# Patient Record
Sex: Male | Born: 1965 | Race: White | Hispanic: No | Marital: Married | Smoking: Never smoker
Health system: Southern US, Community
[De-identification: ages and names within clinical notes are randomized; demographics above are authoritative.]

## PROBLEM LIST (undated history)

## (undated) DIAGNOSIS — T7840XA Allergy, unspecified, initial encounter: Secondary | ICD-10-CM

## (undated) HISTORY — DX: Allergy, unspecified, initial encounter: T78.40XA

---

## 1999-10-20 ENCOUNTER — Encounter: Admission: RE | Admit: 1999-10-20 | Discharge: 1999-10-20 | Payer: Self-pay | Admitting: Family Medicine

## 1999-10-20 ENCOUNTER — Encounter: Payer: Self-pay | Admitting: Family Medicine

## 2005-03-29 ENCOUNTER — Encounter: Admission: RE | Admit: 2005-03-29 | Discharge: 2005-03-29 | Payer: Self-pay | Admitting: Family Medicine

## 2006-05-04 ENCOUNTER — Encounter: Admission: RE | Admit: 2006-05-04 | Discharge: 2006-05-04 | Payer: Self-pay | Admitting: Family Medicine

## 2007-01-23 ENCOUNTER — Encounter: Admission: RE | Admit: 2007-01-23 | Discharge: 2007-01-23 | Payer: Self-pay | Admitting: Neurology

## 2007-02-19 ENCOUNTER — Encounter: Admission: RE | Admit: 2007-02-19 | Discharge: 2007-02-19 | Payer: Self-pay | Admitting: Neurology

## 2008-06-15 ENCOUNTER — Encounter: Admission: RE | Admit: 2008-06-15 | Discharge: 2008-06-15 | Payer: Self-pay | Admitting: Family Medicine

## 2010-06-05 IMAGING — CR DG CHEST 2V
2 series · 2 of 2 positions shown · non-contrast
Comparison: None

CLINICAL DATA: Cough.  Nonsmoker.

CHEST - 2 VIEW

[w chest pa]
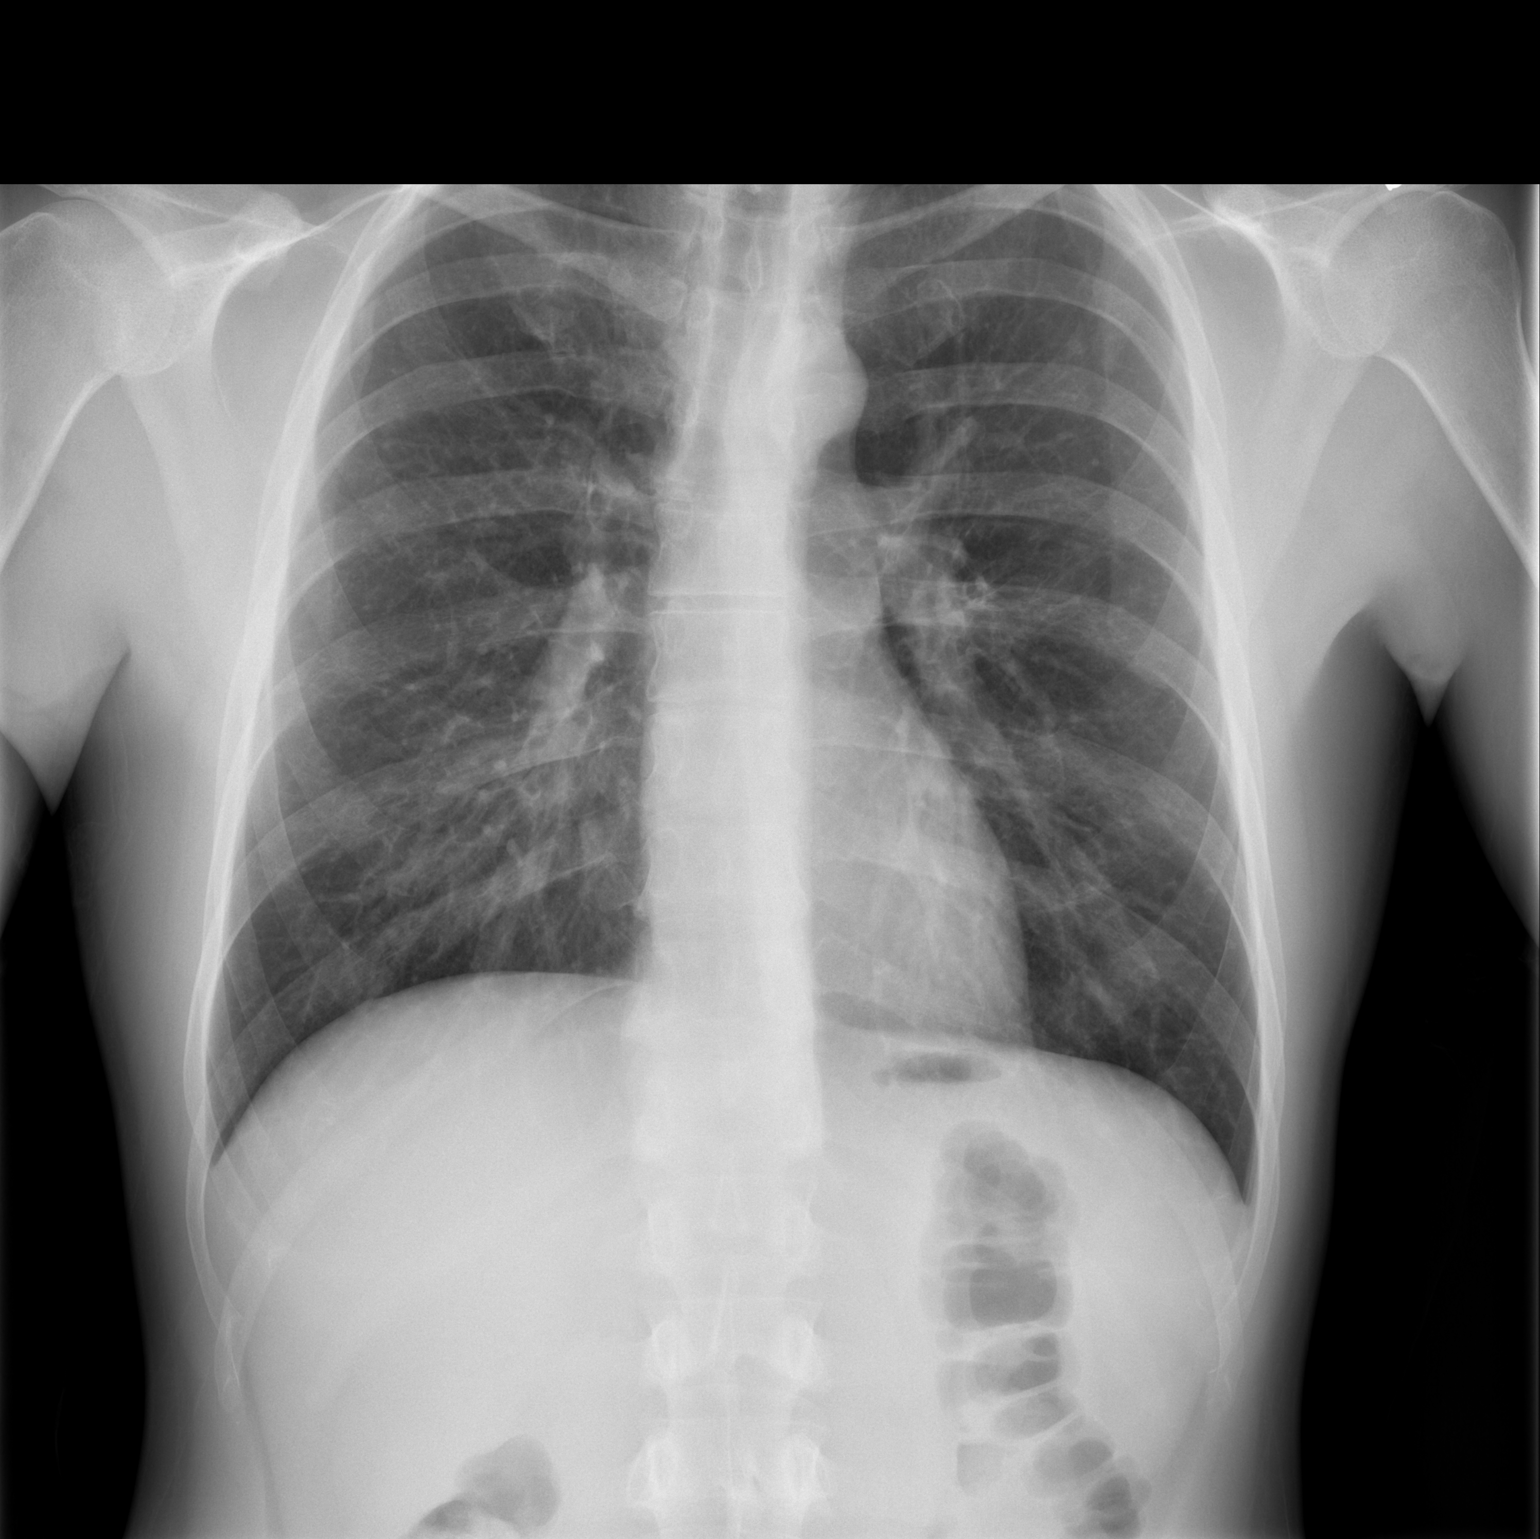

[w chest lat]
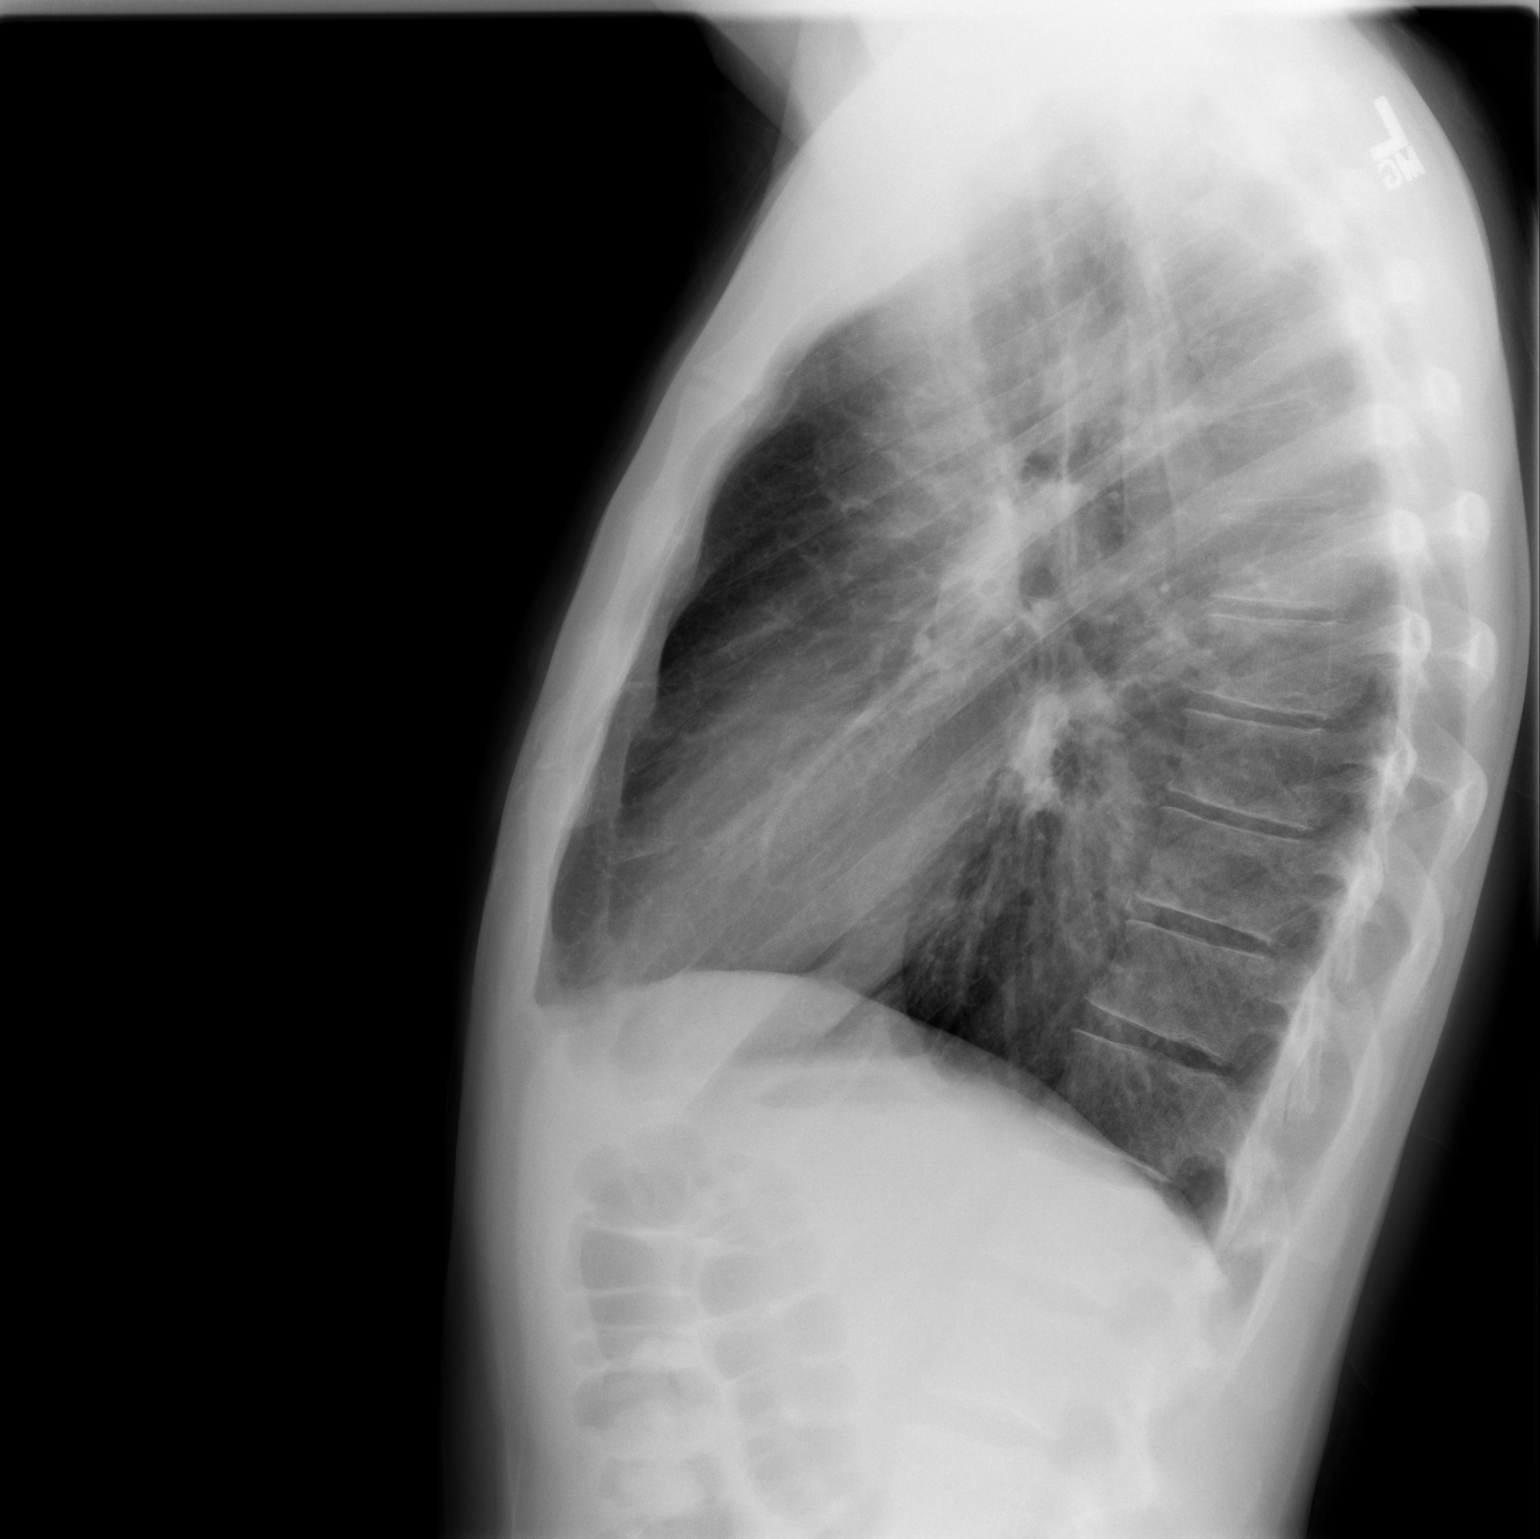

[2 of 2 positions shown; findings below may reference images not displayed]

FINDINGS: Generalized prominence bronchopulmonary markings of
asthma or bronchitis seen.  No focal pneumonia noted with lungs
otherwise clear.  Heart size and configuration appear normal.
Mediastinum, hila, pleura and osseous structures are unremarkable.
IMPRESSION: 1.  Slight changes of asthma or bronchitis without pneumonia.
2.  Otherwise no active cardiopulmonary disease.

REF:G3 DICTATED: 06/15/2008 [DATE]

## 2018-01-23 DIAGNOSIS — R0683 Snoring: Secondary | ICD-10-CM | POA: Insufficient documentation

## 2018-02-03 ENCOUNTER — Other Ambulatory Visit (HOSPITAL_BASED_OUTPATIENT_CLINIC_OR_DEPARTMENT_OTHER): Payer: Self-pay

## 2018-02-03 DIAGNOSIS — R0683 Snoring: Secondary | ICD-10-CM

## 2018-02-18 ENCOUNTER — Ambulatory Visit (HOSPITAL_BASED_OUTPATIENT_CLINIC_OR_DEPARTMENT_OTHER): Payer: Managed Care, Other (non HMO) | Attending: Otolaryngology | Admitting: Internal Medicine

## 2018-02-18 VITALS — Ht 71.0 in | Wt 150.0 lb

## 2018-02-18 DIAGNOSIS — R0683 Snoring: Secondary | ICD-10-CM | POA: Diagnosis not present

## 2018-02-22 DIAGNOSIS — R0683 Snoring: Secondary | ICD-10-CM | POA: Diagnosis not present

## 2018-02-22 NOTE — Procedures (Signed)
   Patient Name: Dwayne Dean, Dwayne Dean Date: 02/18/2018 Gender: Male D.O.B: January 20, 1966 Age (years): 11 Referring Provider: Izora Gala Height (inches): 60 Interpreting Physician: Baird Lyons MD, ABSM Weight (lbs): 150 RPSGT: Jacolyn Reedy BMI: 21 MRN: [id number] Neck Size: 16.00  CLINICAL INFORMATION Sleep Study Type: HST Indication for sleep study: Snoring  Epworth Sleepiness Score: 7  SLEEP STUDY TECHNIQUE A multi-channel overnight portable sleep study was performed. The channels recorded were: nasal airflow, thoracic respiratory movement, and oxygen saturation with a pulse oximetry. Snoring was also monitored.  MEDICATIONS Patient self administered medications include: none reported.  SLEEP ARCHITECTURE Patient was studied for 425.3 minutes. The sleep efficiency was 99.5 % and the patient was supine for 59.3%. The arousal index was 0.0 per hour.  RESPIRATORY PARAMETERS The overall AHI was 3.0 per hour, with a central apnea index of 0.0 per hour. The oxygen nadir was 92% during sleep.  CARDIAC DATA Mean heart rate during sleep was 49.5 bpm.  IMPRESSIONS - No significant obstructive sleep apnea occurred during this study (AHI = 3.0/h). - No significant central sleep apnea occurred during this study (CAI = 0.0/h). - The patient had minimal or no oxygen desaturation during the study (Min O2 = 92%) - Patient snored.  DIAGNOSIS - Normal study - Primary snoring  RECOMMENDATIONS - Consider measures to address snoring based on clinical judgment. - Sleep hygiene should be reviewed to assess factors that may improve sleep quality. - Weight management and regular exercise should be initiated or continued.  [Electronically signed] 02/22/2018 12:00 PM  Baird Lyons MD, [place], American Board of Sleep Medicine   NPI: [id number]                          [place], [place] [facility]  ELECTRONICALLY SIGNED  ON:  02/22/2018, 11:58 AM [place] PH: [phone]   FX: [phone] [place]

## 2019-04-22 ENCOUNTER — Encounter: Payer: Self-pay | Admitting: Internal Medicine

## 2019-05-26 ENCOUNTER — Ambulatory Visit (AMBULATORY_SURGERY_CENTER): Payer: Self-pay | Admitting: *Deleted

## 2019-05-26 ENCOUNTER — Other Ambulatory Visit: Payer: Self-pay

## 2019-05-26 VITALS — Temp 97.1°F | Ht 70.5 in | Wt 145.0 lb

## 2019-05-26 DIAGNOSIS — Z1211 Encounter for screening for malignant neoplasm of colon: Secondary | ICD-10-CM

## 2019-05-26 DIAGNOSIS — Z01818 Encounter for other preprocedural examination: Secondary | ICD-10-CM

## 2019-05-26 NOTE — Progress Notes (Signed)

## 2019-06-01 ENCOUNTER — Encounter: Payer: Self-pay | Admitting: Internal Medicine

## 2019-06-04 ENCOUNTER — Other Ambulatory Visit: Payer: Self-pay | Admitting: Internal Medicine

## 2019-06-04 ENCOUNTER — Ambulatory Visit (INDEPENDENT_AMBULATORY_CARE_PROVIDER_SITE_OTHER): Payer: Managed Care, Other (non HMO)

## 2019-06-04 DIAGNOSIS — Z1159 Encounter for screening for other viral diseases: Secondary | ICD-10-CM

## 2019-06-04 LAB — SARS CORONAVIRUS 2 (TAT 6-24 HRS): SARS Coronavirus 2: NEGATIVE

## 2019-06-09 ENCOUNTER — Other Ambulatory Visit: Payer: Self-pay

## 2019-06-09 ENCOUNTER — Encounter: Payer: Self-pay | Admitting: Internal Medicine

## 2019-06-09 ENCOUNTER — Ambulatory Visit (AMBULATORY_SURGERY_CENTER): Payer: Managed Care, Other (non HMO) | Admitting: Internal Medicine

## 2019-06-09 VITALS — BP 96/59 | HR 57 | Temp 96.6°F | Resp 18 | Ht 70.0 in | Wt 145.0 lb

## 2019-06-09 DIAGNOSIS — D120 Benign neoplasm of cecum: Secondary | ICD-10-CM

## 2019-06-09 DIAGNOSIS — Z1211 Encounter for screening for malignant neoplasm of colon: Secondary | ICD-10-CM | POA: Diagnosis not present

## 2019-06-09 MED ORDER — SODIUM CHLORIDE 0.9 % IV SOLN: 500.0000 mL | Freq: Once | INTRAVENOUS | Status: DC

## 2019-06-09 NOTE — Patient Instructions (Addendum)
I found and removed a very small - tiny polyp. 1-2 mm.  It might be pre-cancerous - not a problem but we remove those so they won't become a problem.  I will let you know - the earliest repeat exam would be in 7 years.  I appreciate the opportunity to care for you. Gatha Mayer, MD, [facility]  Handout given for polyps.  YOU HAD AN ENDOSCOPIC PROCEDURE TODAY AT [facility]:   Refer to the procedure report that was given to you for any specific questions about what was found during the examination.  If the procedure report does not answer your questions, please call your gastroenterologist to clarify.  If you requested that your care partner not be given the details of your procedure findings, then the procedure report has been included in a sealed envelope for you to review at your convenience later.  YOU SHOULD EXPECT: Some feelings of bloating in the abdomen. Passage of more gas than usual.  Walking can help get rid of the air that was put into your GI tract during the procedure and reduce the bloating. If you had a lower endoscopy (such as a colonoscopy or flexible sigmoidoscopy) you may notice spotting of blood in your stool or on the toilet paper. If you underwent a bowel prep for your procedure, you may not have a normal bowel movement for a few days.  Please Note:  You might notice some irritation and congestion in your nose or some drainage.  This is from the oxygen used during your procedure.  There is no need for concern and it should clear up in a day or so.  SYMPTOMS TO REPORT IMMEDIATELY:   Following lower endoscopy (colonoscopy or flexible sigmoidoscopy):  Excessive amounts of blood in the stool  Significant tenderness or worsening of abdominal pains  Swelling of the abdomen that is new, acute  Fever of 100F or higher  For urgent or emergent issues, a gastroenterologist can be reached at any hour by calling [phone].   DIET:  We do recommend a small  meal at first, but then you may proceed to your regular diet.  Drink plenty of fluids but you should avoid alcoholic beverages for 24 hours.  ACTIVITY:  You should plan to take it easy for the rest of today and you should NOT DRIVE or use heavy machinery until tomorrow (because of the sedation medicines used during the test).    FOLLOW UP: Our staff will call the number listed on your records 48-72 hours following your procedure to check on you and address any questions or concerns that you may have regarding the information given to you following your procedure. If we do not reach you, we will leave a message.  We will attempt to reach you two times.  During this call, we will ask if you have developed any symptoms of COVID 19. If you develop any symptoms (ie: fever, flu-like symptoms, shortness of breath, cough etc.) before then, please call [phone].  If you test positive for Covid 19 in the 2 weeks post procedure, please call and report this information to [place].    If any biopsies were taken you will be contacted by phone or by letter within the next 1-3 weeks.  Please call us at [phone] if you have not heard about the biopsies in 3 weeks.    SIGNATURES/CONFIDENTIALITY: You and/or your care partner have signed paperwork which will be entered into your electronic medical record.  These signatures attest to the fact that that the information above on your After Visit Summary has been reviewed and is understood.  Full responsibility of the confidentiality of this discharge information lies with you and/or your care-partner.

## 2019-06-09 NOTE — Progress Notes (Signed)
Called to room to assist during endoscopic procedure.  Patient ID and intended procedure confirmed with present staff. Received instructions for my participation in the procedure from the performing physician.  

## 2019-06-09 NOTE — Progress Notes (Signed)
Pt's states no medical or surgical changes since previsit or office visit. 

## 2019-06-09 NOTE — Progress Notes (Signed)
Pt tolerated well. VSS. Awake and to recovery. 

## 2019-06-09 NOTE — Op Note (Signed)
[place] Patient Name: Dwayne Dean Procedure Date: 06/09/2019 9:14 AM MRN: [id number] Endoscopist: Gatha Mayer , MD Age: 54 Referring MD:  Date of Birth: 12-23-1965 Gender: Male Account #: [id number] Procedure:                Colonoscopy Indications:              Screening for colorectal malignant neoplasm, This                            is the patient's first colonoscopy Medicines:                Propofol per Anesthesia, Monitored Anesthesia Care Procedure:                Pre-Anesthesia Assessment:                           - Prior to the procedure, a History and Physical                            was performed, and patient medications and                            allergies were reviewed. The patient's tolerance of                            previous anesthesia was also reviewed. The risks                            and benefits of the procedure and the sedation                            options and risks were discussed with the patient.                            All questions were answered, and informed consent                            was obtained. Prior Anticoagulants: The patient has                            taken no previous anticoagulant or antiplatelet                            agents. ASA Grade Assessment: I - A normal, healthy                            patient. After reviewing the risks and benefits,                            the patient was deemed in satisfactory condition to                            undergo the procedure.  After obtaining informed consent, the colonoscope                            was passed under direct vision. Throughout the                            procedure, the patient's blood pressure, pulse, and                            oxygen saturations were monitored continuously. The                            Colonoscope was introduced through the anus and                            advanced to the  the cecum, identified by                            appendiceal orifice and ileocecal valve. The                            colonoscopy was performed without difficulty. The                            patient tolerated the procedure well. The quality                            of the bowel preparation was excellent. The                            ileocecal valve, appendiceal orifice, and rectum                            were photographed. The bowel preparation used was                            Miralax via split dose instruction. Scope In: 9:28:57 AM Scope Out: 9:45:54 AM Scope Withdrawal Time: 0 hours 10 minutes 56 seconds  Total Procedure Duration: 0 hours 16 minutes 57 seconds  Findings:                 The perianal and digital rectal examinations were                            normal. Pertinent negatives include normal prostate                            (size, shape, and consistency).                           A 1 to 2 mm polyp was found in the cecum. The polyp                            was sessile. The polyp was removed with a cold  biopsy forceps. Resection and retrieval were                            complete. Verification of patient identification                            for the specimen was done. Estimated blood loss was                            minimal.                           The exam was otherwise without abnormality on                            direct and retroflexion views. Complications:            No immediate complications. Estimated Blood Loss:     Estimated blood loss was minimal. Impression:               - One 1 to 2 mm polyp in the cecum, removed with a                            cold biopsy forceps. Resected and retrieved.                           - The examination was otherwise normal on direct                            and retroflexion views. Recommendation:           - Patient has a contact number available for                             emergencies. The signs and symptoms of potential                            delayed complications were discussed with the                            patient. Return to normal activities tomorrow.                            Written discharge instructions were provided to the                            patient.                           - Resume previous diet.                           - Continue present medications.                           - Repeat colonoscopy is recommended. The  colonoscopy date will be determined after pathology                            results from today's exam become available for                            review. Gatha Mayer, MD 06/09/2019 9:55:10 AM This report has been signed electronically.

## 2019-06-11 ENCOUNTER — Telehealth: Payer: Self-pay | Admitting: *Deleted

## 2019-06-11 NOTE — Telephone Encounter (Signed)
  Follow up Call-  Call back number 06/09/2019  Post procedure Call Back phone  # [phone]  Permission to leave phone message Yes  Some recent data might be hidden     Patient questions:  Message left to call us if necessary.

## 2019-06-11 NOTE — Telephone Encounter (Signed)
  Follow up Call-  Call back number 06/09/2019  Post procedure Call Back phone  # [phone]  Permission to leave phone message Yes  Some recent data might be hidden     Patient questions:  Do you have a fever, pain , or abdominal swelling? No. Pain Score  0 *  Have you tolerated food without any problems? Yes.    Have you been able to return to your normal activities? Yes.    Do you have any questions about your discharge instructions: Diet   No. Medications  No. Follow up visit  No.  Do you have questions or concerns about your Care? No.  Actions: * If pain score is 4 or above: No action needed, pain <4.   1. Have you developed a fever since your procedure? no  2.   Have you had an respiratory symptoms (SOB or cough) since your procedure? no  3.   Have you tested positive for COVID 19 since your procedure no  4.   Have you had any family members/close contacts diagnosed with the COVID 19 since your procedure?  no   If yes to any of these questions please route to Joylene John, RN and Alphonsa Gin, Therapist, sports.

## 2019-06-12 ENCOUNTER — Encounter: Payer: Self-pay | Admitting: Internal Medicine

## 2019-06-12 DIAGNOSIS — Z86010 Personal history of colonic polyps: Secondary | ICD-10-CM

## 2019-06-12 HISTORY — DX: Personal history of colonic polyps: Z86.010

## 2021-04-17 ENCOUNTER — Ambulatory Visit: Payer: 59 | Admitting: Podiatry

## 2021-04-17 ENCOUNTER — Other Ambulatory Visit: Payer: Self-pay

## 2021-04-17 ENCOUNTER — Ambulatory Visit (INDEPENDENT_AMBULATORY_CARE_PROVIDER_SITE_OTHER): Payer: 59

## 2021-04-17 DIAGNOSIS — M205X2 Other deformities of toe(s) (acquired), left foot: Secondary | ICD-10-CM | POA: Diagnosis not present

## 2021-04-17 NOTE — Progress Notes (Signed)
   HPI: 55 y.o. male presenting today for evaluation of left great toe pain is been going on for few months now.  He says it is slowly and gradually increased in pain.  Patient is an avid walker and hiker and notices pain specifically when he has to bend or really flex his toe.  He says that his left great toe does not have the same movement as the right great toe.  It is painful when standing on his tiptoes.  He presents for further treatment evaluation.  Currently he has not done anything for treatment  Past Medical History:  Diagnosis Date   Allergy    Hx of adenomatous polyp of colon 06/12/2019     Physical Exam: General: The patient is alert and oriented x3 in no acute distress.  Dermatology: Skin is warm, dry and supple bilateral lower extremities. Negative for open lesions or macerations.  Vascular: Palpable pedal pulses bilaterally. No edema or erythema noted. Capillary refill within normal limits.  Neurological: Epicritic and protective threshold grossly intact bilaterally.   Musculoskeletal Exam: Limited range of motion with crepitus noted to the left great toe and associated pain with palpation and range of motion as well.  Radiographic Exam:  Normal osseous mineralization.  Joint space narrowing with periarticular spurring noted to the left first MTP joint.  There is also some fragmented spurs/or possible old avulsion fractures to the lateral portion of the first MTP joint possibly due to a history of injury  Assessment: 1.  Hallux limitus left   Plan of Care:  1. Patient evaluated. X-Rays reviewed.  2.  Today we discussed different treatment options both conservative and surgical.  Patient states that his pain is 2-3/10.  Today were going to continue and pursue conservative treatment options 3.  Recommend stiff soled shoes that do not allow much flex in the forefoot and toebox area 4.  Recommend OTC ibuprofen as needed 5.  Patient may continue full activity however I do  recommend modifying certain activities that would be less strenuous on his toe.  Avoid running.  Bicycling would be ideal 6.  Return to clinic as needed  Theme park manager for a company.      Edrick Kins, DPM [facility]  Dr. Edrick Kins, DPM    [address], [place] [address]                Office [phone]  Fax [phone]

## 2023-01-09 ENCOUNTER — Other Ambulatory Visit (HOSPITAL_BASED_OUTPATIENT_CLINIC_OR_DEPARTMENT_OTHER): Payer: Self-pay | Admitting: Physician Assistant

## 2023-01-09 DIAGNOSIS — Z136 Encounter for screening for cardiovascular disorders: Secondary | ICD-10-CM

## 2023-01-22 ENCOUNTER — Ambulatory Visit (HOSPITAL_BASED_OUTPATIENT_CLINIC_OR_DEPARTMENT_OTHER)
Admission: RE | Admit: 2023-01-22 | Discharge: 2023-01-22 | Disposition: A | Discharge status: Home / Self Care | Payer: Managed Care, Other (non HMO) | Source: Ambulatory Visit | Attending: Physician Assistant | Admitting: Physician Assistant

## 2023-01-22 DIAGNOSIS — Z136 Encounter for screening for cardiovascular disorders: Secondary | ICD-10-CM | POA: Insufficient documentation

## 2023-04-14 DIAGNOSIS — Z419 Encounter for procedure for purposes other than remedying health state, unspecified: Secondary | ICD-10-CM | POA: Diagnosis not present

## 2023-05-15 DIAGNOSIS — Z419 Encounter for procedure for purposes other than remedying health state, unspecified: Secondary | ICD-10-CM | POA: Diagnosis not present

## 2023-06-15 DIAGNOSIS — Z419 Encounter for procedure for purposes other than remedying health state, unspecified: Secondary | ICD-10-CM | POA: Diagnosis not present

## 2023-06-17 DIAGNOSIS — D225 Melanocytic nevi of trunk: Secondary | ICD-10-CM | POA: Diagnosis not present

## 2023-06-17 DIAGNOSIS — L905 Scar conditions and fibrosis of skin: Secondary | ICD-10-CM | POA: Diagnosis not present

## 2023-06-17 DIAGNOSIS — D485 Neoplasm of uncertain behavior of skin: Secondary | ICD-10-CM | POA: Diagnosis not present

## 2023-07-13 DIAGNOSIS — Z419 Encounter for procedure for purposes other than remedying health state, unspecified: Secondary | ICD-10-CM | POA: Diagnosis not present

## 2023-08-24 DIAGNOSIS — Z419 Encounter for procedure for purposes other than remedying health state, unspecified: Secondary | ICD-10-CM | POA: Diagnosis not present

## 2023-09-23 DIAGNOSIS — Z419 Encounter for procedure for purposes other than remedying health state, unspecified: Secondary | ICD-10-CM | POA: Diagnosis not present

## 2023-11-20 DIAGNOSIS — M79671 Pain in right foot: Secondary | ICD-10-CM | POA: Diagnosis not present

## 2023-12-04 DIAGNOSIS — M2021 Hallux rigidus, right foot: Secondary | ICD-10-CM | POA: Diagnosis not present

## 2023-12-04 DIAGNOSIS — M19071 Primary osteoarthritis, right ankle and foot: Secondary | ICD-10-CM | POA: Diagnosis not present

## 2023-12-04 DIAGNOSIS — M79671 Pain in right foot: Secondary | ICD-10-CM | POA: Diagnosis not present

## 2023-12-19 DIAGNOSIS — Z125 Encounter for screening for malignant neoplasm of prostate: Secondary | ICD-10-CM | POA: Diagnosis not present

## 2023-12-26 DIAGNOSIS — Z0000 Encounter for general adult medical examination without abnormal findings: Secondary | ICD-10-CM | POA: Diagnosis not present

## 2023-12-26 DIAGNOSIS — Z125 Encounter for screening for malignant neoplasm of prostate: Secondary | ICD-10-CM | POA: Diagnosis not present

## 2023-12-26 DIAGNOSIS — Z1329 Encounter for screening for other suspected endocrine disorder: Secondary | ICD-10-CM | POA: Diagnosis not present

## 2023-12-26 DIAGNOSIS — Z13220 Encounter for screening for lipoid disorders: Secondary | ICD-10-CM | POA: Diagnosis not present

## 2024-02-11 DIAGNOSIS — M25871 Other specified joint disorders, right ankle and foot: Secondary | ICD-10-CM | POA: Diagnosis not present

## 2024-02-11 DIAGNOSIS — M2021 Hallux rigidus, right foot: Secondary | ICD-10-CM | POA: Diagnosis not present

## 2024-02-27 DIAGNOSIS — M79671 Pain in right foot: Secondary | ICD-10-CM | POA: Diagnosis not present

## 2024-03-05 ENCOUNTER — Other Ambulatory Visit: Payer: Self-pay | Admitting: Orthopaedic Surgery

## 2024-03-05 DIAGNOSIS — M19071 Primary osteoarthritis, right ankle and foot: Secondary | ICD-10-CM

## 2024-03-05 DIAGNOSIS — M25871 Other specified joint disorders, right ankle and foot: Secondary | ICD-10-CM | POA: Diagnosis not present

## 2024-03-09 ENCOUNTER — Ambulatory Visit
Admission: RE | Admit: 2024-03-09 | Discharge: 2024-03-09 | Disposition: A | Discharge status: Home / Self Care | Payer: Self-pay | Source: Ambulatory Visit | Attending: Orthopaedic Surgery | Admitting: Orthopaedic Surgery

## 2024-03-09 DIAGNOSIS — M79671 Pain in right foot: Secondary | ICD-10-CM | POA: Diagnosis not present

## 2024-03-09 DIAGNOSIS — M19071 Primary osteoarthritis, right ankle and foot: Secondary | ICD-10-CM

## 2024-03-09 DIAGNOSIS — S99921A Unspecified injury of right foot, initial encounter: Secondary | ICD-10-CM | POA: Diagnosis not present

## 2024-03-12 DIAGNOSIS — M19071 Primary osteoarthritis, right ankle and foot: Secondary | ICD-10-CM | POA: Diagnosis not present
